# Patient Record
Sex: Male | Born: 1995 | Race: Black or African American | Hispanic: No | Marital: Single | Smoking: Never smoker
Health system: Southern US, Community
[De-identification: ages and names within clinical notes are randomized; demographics above are authoritative.]

---

## 2018-06-08 ENCOUNTER — Ambulatory Visit
Admission: EM | Admit: 2018-06-08 | Discharge: 2018-06-08 | Disposition: A | Discharge status: Home / Self Care | Payer: Managed Care, Other (non HMO) | Attending: Family Medicine | Admitting: Family Medicine

## 2018-06-08 ENCOUNTER — Other Ambulatory Visit: Payer: Self-pay

## 2018-06-08 ENCOUNTER — Ambulatory Visit (INDEPENDENT_AMBULATORY_CARE_PROVIDER_SITE_OTHER): Payer: Managed Care, Other (non HMO)

## 2018-06-08 ENCOUNTER — Encounter: Payer: Self-pay | Admitting: Emergency Medicine

## 2018-06-08 DIAGNOSIS — M79672 Pain in left foot: Secondary | ICD-10-CM | POA: Diagnosis not present

## 2018-06-08 DIAGNOSIS — S92353A Displaced fracture of fifth metatarsal bone, unspecified foot, initial encounter for closed fracture: Secondary | ICD-10-CM

## 2018-06-08 DIAGNOSIS — S92355A Nondisplaced fracture of fifth metatarsal bone, left foot, initial encounter for closed fracture: Secondary | ICD-10-CM

## 2018-06-08 DIAGNOSIS — Y9373 Activity, racquet and hand sports: Secondary | ICD-10-CM

## 2018-06-08 NOTE — Discharge Instructions (Addendum)
Ice. Keep in splint and use crutches. Rest. Drink plenty of fluids.   Follow up with your primary care physician this week as needed. Return to Urgent care for new or worsening concerns.

## 2018-06-08 NOTE — ED Provider Notes (Signed)
[facility] ____________________________________________  Time seen: Approximately 3:26 PM  I have reviewed the triage vital signs and the nursing notes.   HISTORY  Chief Complaint Foot Pain (left)   HPI Matthew Bullock is a 22 y.o. male presented for evaluation of lateral left foot pain post injury that occurred yesterday afternoon.  Patient states that he was playing tennis and excellently rolled his left foot outwards causing immediate onset of pain.  States that he has been using his grandfathers crutches since injury has not been able to tolerate weightbearing.  States that he is not putting direct pressure on the foot he has normal to no pain.  Has not taken any over-the-counter medications for the same complaints.  Denies other alleviating measures attempted.  Pain is mostly with direct palpation and weightbearing.  Denies paresthesias, pain radiation or other complaints. Denies recent sickness. Denies recent antibiotic use.    History reviewed. No pertinent past medical history. Denies  There are no active problems to display for this patient.   History reviewed. No pertinent surgical history.   No current facility-administered medications for this encounter.  No current outpatient medications on file.  Allergies Patient has no known allergies.  Family History  Problem Relation Age of Onset  . Anemia Mother   . Scoliosis Mother   . Healthy Father     Social History Social History   Tobacco Use  . Smoking status: Never Smoker  . Smokeless tobacco: Never Used  Substance Use Topics  . Alcohol use: Yes    Comment: occassionally  . Drug use: Yes    Frequency: 3.0 times per week    Types: Marijuana    Review of Systems Constitutional: No fever Cardiovascular: Denies chest pain. Respiratory: Denies shortness of breath. Gastrointestinal: No abdominal pain.   Musculoskeletal: Negative for back pain. AS above.  Skin: Negative for  rash.   ____________________________________________   PHYSICAL EXAM:  VITAL SIGNS: ED Triage Vitals  Enc Vitals Group     BP 06/08/18 1402 (!) 108/94     Pulse Rate 06/08/18 1402 68     Resp 06/08/18 1402 16     Temp 06/08/18 1402 99.2 F (37.3 C)     Temp Source 06/08/18 1402 Oral     SpO2 06/08/18 1402 100 %     Weight 06/08/18 1403 152 lb (68.9 kg)     Height 06/08/18 1403 5\' 8"  (1.727 m)     Head Circumference --      Peak Flow --      Pain Score 06/08/18 1403 8     Pain Loc --      Pain Edu? --      Excl. in GC? --     Constitutional: Alert and oriented. Well appearing and in no acute distress. ENT      Head: Normocephalic and atraumatic. Cardiovascular: Normal rate, regular rhythm. Grossly normal heart sounds.  Good peripheral circulation. Respiratory: Normal respiratory effort without tachypnea nor retractions. Breath sounds are clear and equal bilaterally. No wheezes, rales, rhonchi. Musculoskeletal:  Bilateral pedal pulses equal and easily palpated.  Gait not tested due to pain. Left lateral dorsal foot mild to moderate localized edema, moderate tenderness to palpation mid to proximal fifth metatarsal, skin intact, left foot and ankle otherwise nontender.  Normal distal sensation and capillary refill to left foot. Neurologic:  Normal speech and language. Speech is normal. No gait instability.  Skin:  Skin is warm, dry and intact. No rash noted. Psychiatric:  Mood and affect are normal. Speech and behavior are normal. Patient exhibits appropriate insight and judgment   ___________________________________________   LABS (all labs ordered are listed, but only abnormal results are displayed)  Labs Reviewed - No data to display  RADIOLOGY  Dg Foot Complete Left  Result Date: 06/08/2018 CLINICAL DATA:  22 year old male status post twisting injury playing tennis yesterday with lateral pain and swelling. EXAM: LEFT FOOT - COMPLETE 3+ VIEW COMPARISON:  None.  FINDINGS: Oblique and perhaps mildly comminuted fracture through the base of the left 5th metatarsal. The proximal fragment is minimally displaced. Underlying bone mineralization is normal. No other fracture or dislocation identified. IMPRESSION: Minimally displaced fracture through the base of the left 5th metatarsal. Electronically Signed   By: Odessa FlemingH  Hall M.D.   On: 06/08/2018 14:35   ____________________________________________   PROCEDURES Procedures  Left foot posterior OCL splint applied by RN.  INITIAL IMPRESSION / ASSESSMENT AND PLAN / ED COURSE  Pertinent labs & imaging results that were available during my care of the patient were reviewed by me and considered in my medical decision making (see chart for details).  Well-appearing patient.  No acute distress.  Presented for evaluation left foot pain post mechanical injury that occurred yesterday.  Left foot x-ray as above per radiologist, minimally displaced fracture through base of fifth metatarsal.  Due to edema and painful weightbearing, will place in posterior OCL splint and continue crutches use, has family members crutches at bedside.  Follow-up with podiatry this week.  Information given.  Denies need for pain medication.  Ice elevation, over-the-counter Tylenol ibuprofen.  Discussed follow up with Primary care physician this week. Discussed follow up and return parameters including no resolution or any worsening concerns. Patient verbalized understanding and agreed to plan.   ____________________________________________   FINAL CLINICAL IMPRESSION(S) / ED DIAGNOSES  Final diagnoses:  Closed displaced fracture of fifth metatarsal bone, unspecified laterality, initial encounter     ED Discharge Orders    None       Note: This dictation was prepared with Dragon dictation along with smaller phrase technology. Any transcriptional errors that result from this process are unintentional.         Renford DillsMiller, Nasir Bright,  NP 06/08/18 1529

## 2018-06-08 NOTE — ED Triage Notes (Addendum)
Patient in today c/o left foot pain after playing tennis yesterday. Patient states he turned his foot outward, but did not fall.

## 2019-02-15 IMAGING — CR DG FOOT COMPLETE 3+V*L*
3 series · 3 of 3 positions shown · non-contrast
Comparison: None.

CLINICAL DATA: 21-year-old male status post twisting injury playing
tennis yesterday with lateral pain and swelling.

EXAM:
LEFT FOOT - COMPLETE 3+ VIEW

[foot ap]
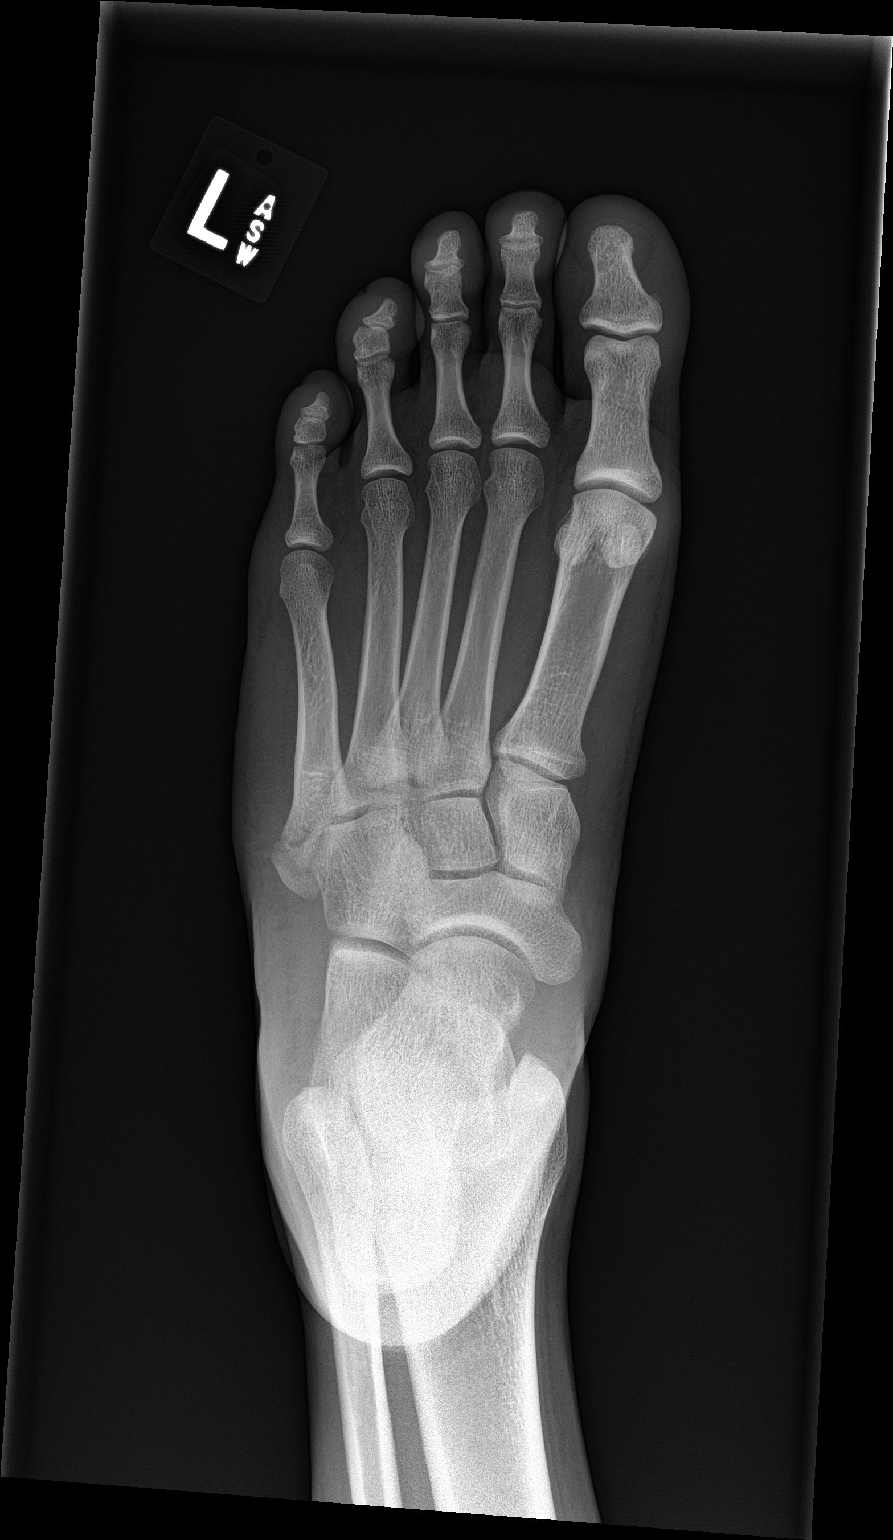

[foot obl]
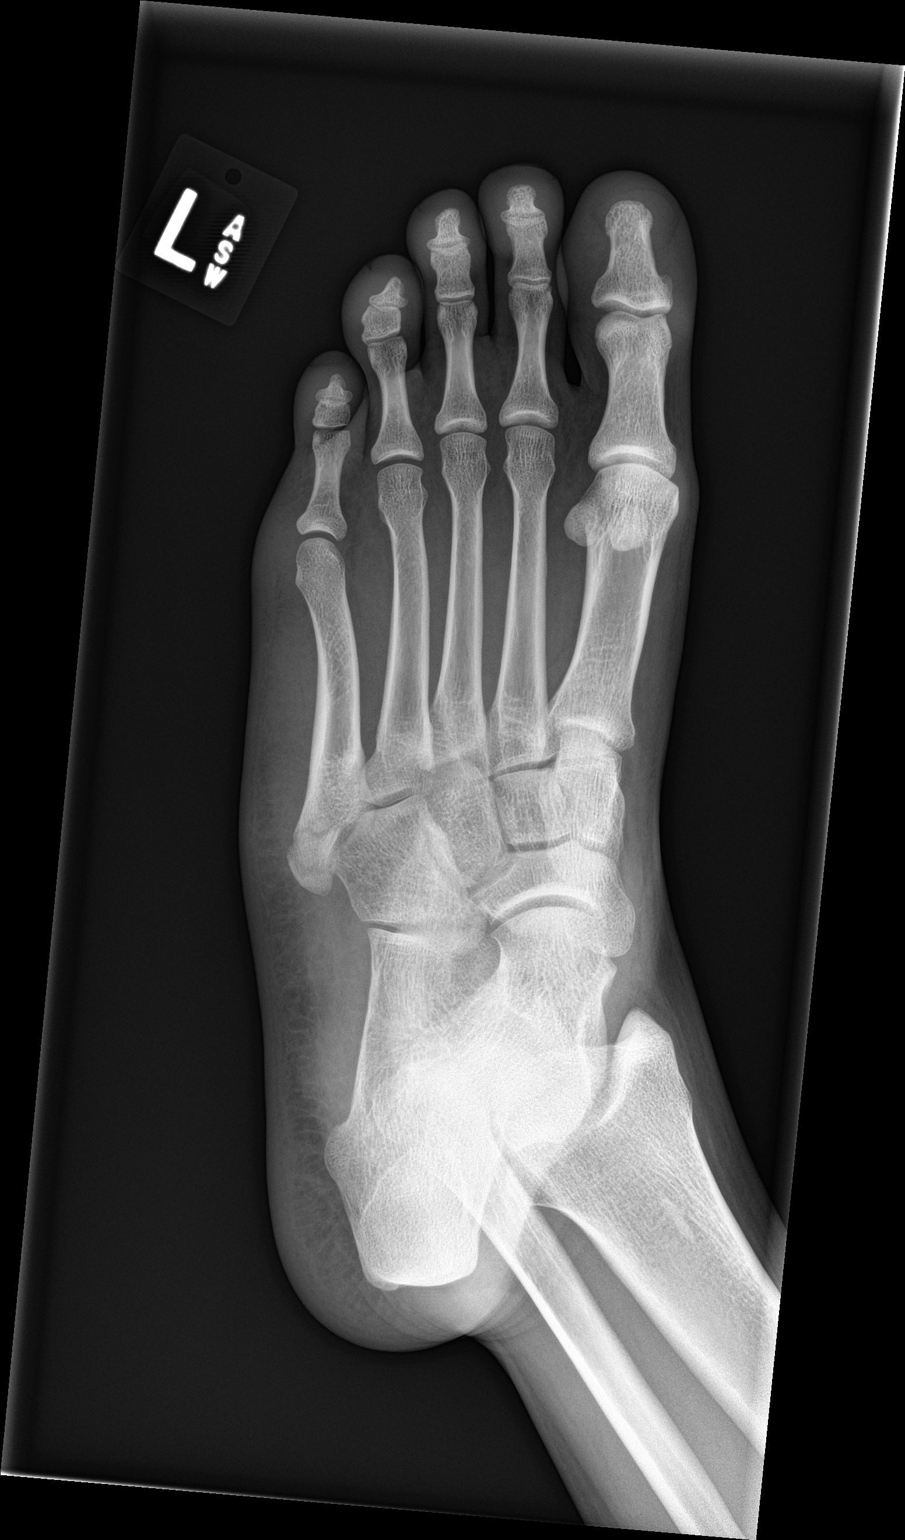

[foot lat]
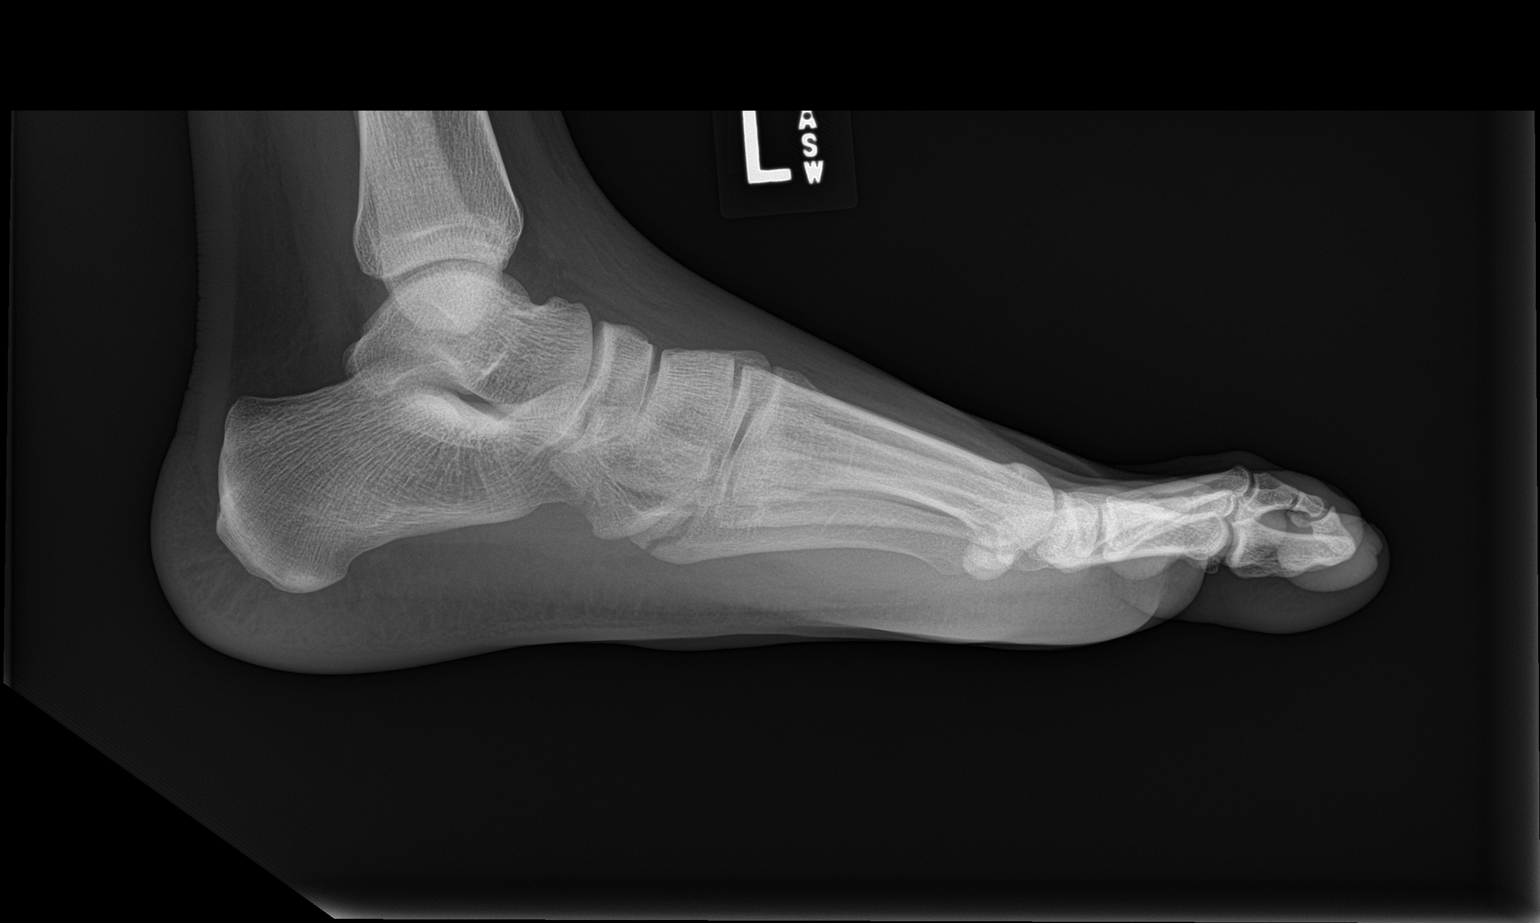

[3 of 3 positions shown; findings below may reference images not displayed]

FINDINGS: Oblique and perhaps mildly comminuted fracture through the base of
the left 5th metatarsal. The proximal fragment is minimally
displaced.

Underlying bone mineralization is normal. No other fracture or
dislocation identified.
IMPRESSION: Minimally displaced fracture through the base of the left 5th
metatarsal.

## 2020-01-29 ENCOUNTER — Ambulatory Visit: Payer: Managed Care, Other (non HMO) | Attending: Internal Medicine

## 2020-01-29 DIAGNOSIS — Z23 Encounter for immunization: Secondary | ICD-10-CM

## 2020-01-29 NOTE — Progress Notes (Signed)
   Covid-19 Vaccination Clinic  Name:  Matthew Bullock    MRN: [id number] DOB: Sep 14, 1996  01/29/2020  [name] was observed post Covid-19 immunization for 15 minutes without incident. He was provided with Vaccine Information Sheet and instruction to access the V-Safe system.   [name] was instructed to call 911 with any severe reactions post vaccine: Marland Kitchen Difficulty breathing  . Swelling of face and throat  . A fast heartbeat  . A bad rash all over body  . Dizziness and weakness   Immunizations Administered    Name Date Dose VIS Date Route   Pfizer COVID-19 Vaccine 01/29/2020  6:39 PM 0.3 mL 10/21/2019 Intramuscular   Manufacturer: [employer], [employer]   Lot: [id number]   NDC: [address]-4

## 2020-02-19 ENCOUNTER — Ambulatory Visit: Payer: Managed Care, Other (non HMO) | Attending: Internal Medicine
